# Patient Record
Sex: Male | Born: 1990 | Race: White | Hispanic: No | Marital: Single | Smoking: Never smoker
Health system: Southern US, Community
[De-identification: ages and names within clinical notes are randomized; demographics above are authoritative.]

## PROBLEM LIST (undated history)

## (undated) DIAGNOSIS — S43006A Unspecified dislocation of unspecified shoulder joint, initial encounter: Secondary | ICD-10-CM

---

## 2017-11-28 ENCOUNTER — Emergency Department (HOSPITAL_COMMUNITY): Payer: Self-pay

## 2017-11-28 ENCOUNTER — Encounter (HOSPITAL_COMMUNITY): Payer: Self-pay | Admitting: Emergency Medicine

## 2017-11-28 ENCOUNTER — Other Ambulatory Visit: Payer: Self-pay

## 2017-11-28 ENCOUNTER — Emergency Department (HOSPITAL_COMMUNITY)
Admission: EM | Admit: 2017-11-28 | Discharge: 2017-11-28 | Disposition: A | Discharge status: Home / Self Care | Payer: Self-pay | Attending: Emergency Medicine | Admitting: Emergency Medicine

## 2017-11-28 DIAGNOSIS — Y999 Unspecified external cause status: Secondary | ICD-10-CM | POA: Insufficient documentation

## 2017-11-28 DIAGNOSIS — Y9389 Activity, other specified: Secondary | ICD-10-CM | POA: Insufficient documentation

## 2017-11-28 DIAGNOSIS — Y92013 Bedroom of single-family (private) house as the place of occurrence of the external cause: Secondary | ICD-10-CM | POA: Insufficient documentation

## 2017-11-28 DIAGNOSIS — X58XXXA Exposure to other specified factors, initial encounter: Secondary | ICD-10-CM | POA: Insufficient documentation

## 2017-11-28 DIAGNOSIS — Z79899 Other long term (current) drug therapy: Secondary | ICD-10-CM | POA: Insufficient documentation

## 2017-11-28 DIAGNOSIS — S43005A Unspecified dislocation of left shoulder joint, initial encounter: Secondary | ICD-10-CM | POA: Insufficient documentation

## 2017-11-28 HISTORY — DX: Unspecified dislocation of unspecified shoulder joint, initial encounter: S43.006A

## 2017-11-28 MED ORDER — HYDROCODONE-ACETAMINOPHEN 5-325 MG PO TABS: 1.0000 | ORAL_TABLET | ORAL | 0 refills | Status: AC | PRN

## 2017-11-28 MED ORDER — ETOMIDATE 2 MG/ML IV SOLN
16.0000 mg | Freq: Once | INTRAVENOUS | Status: AC
  Administered 2017-11-28: 14 mg via INTRAVENOUS

## 2017-11-28 MED ORDER — ETOMIDATE 2 MG/ML IV SOLN
INTRAVENOUS | Status: AC
  Filled 2017-11-28: qty 10

## 2017-11-28 MED ORDER — MORPHINE SULFATE (PF) 4 MG/ML IV SOLN
4.0000 mg | Freq: Once | INTRAVENOUS | Status: AC
  Administered 2017-11-28: 4 mg via INTRAVENOUS
  Filled 2017-11-28: qty 1

## 2017-11-28 MED ORDER — ETOMIDATE 2 MG/ML IV SOLN
0.2000 mg/kg | Freq: Once | INTRAVENOUS | Status: AC
  Administered 2017-11-28: 9 mg via INTRAVENOUS

## 2017-11-28 MED ORDER — HYDROMORPHONE HCL 2 MG/ML IJ SOLN
1.0000 mg | Freq: Once | INTRAMUSCULAR | Status: AC
  Administered 2017-11-28: 1 mg via INTRAVENOUS

## 2017-11-28 NOTE — ED Notes (Signed)
Patient transported to X-ray 

## 2017-11-28 NOTE — ED Notes (Signed)
Pt given happy meal.  AO x 4.  Texting.

## 2017-11-28 NOTE — ED Provider Notes (Signed)
[facility] EMERGENCY DEPARTMENT Provider Note   CSN: [id number] Arrival date & time: 11/28/17  0243     History   Chief Complaint Chief Complaint  Patient presents with  . Shoulder Pain    HPI Roy Larsen is a 27 y.o. male.  HPI 27 year old male presents emergency department with complaints of shoulder dislocation.  He recently dislocated his shoulder 4 days ago and states that his shoulder popped out while sleeping approximately 1 hour ago.  He presents with moderate to severe pain in his left shoulder with obvious left shoulder deformity consistent with left shoulder dislocation.  No weakness in his left hand.  No other complaints.   Past Medical History:  Diagnosis Date  . Shoulder dislocation     There are no active problems to display for this patient.   History reviewed. No pertinent surgical history.      Home Medications    Prior to Admission medications   Medication Sig Start Date End Date Taking? Authorizing Provider  Melatonin 3 MG TABS Take 1 tablet by mouth at bedtime as needed (sleep).   Yes [provider]  Multiple Vitamin (MULTIVITAMIN) tablet Take 1 tablet by mouth daily.   Yes [provider]  HYDROcodone-acetaminophen (NORCO/VICODIN) 5-325 MG tablet Take 1 tablet by mouth every 4 (four) hours as needed for moderate pain. 11/28/17   Azalia Bilis, MD    Family History No family history on file.  Social History Social History   Tobacco Use  . Smoking status: Never Smoker  . Smokeless tobacco: Never Used  Substance Use Topics  . Alcohol use: Yes  . Drug use: Not Currently     Allergies   Blueberry flavor and Zofran [ondansetron hcl]   Review of Systems Review of Systems  All other systems reviewed and are negative.    Physical Exam Updated Vital Signs BP 124/78   Pulse (!) 51   Temp 98.6 F (37 C) (Oral)   Resp 11   Ht  (1.88 m)   Wt 99.8 kg (220 lb)   SpO2 100%   BMI 28.25 kg/m     Physical Exam  Constitutional: He is oriented to person, place, and time. He appears well-developed and well-nourished.  HENT:  Head: Normocephalic and atraumatic.  Eyes: EOM are normal.  Neck: Normal range of motion.  Cardiovascular: Normal rate and regular rhythm.  Pulmonary/Chest: Effort normal and breath sounds normal. No respiratory distress.  Abdominal: Soft. He exhibits no distension. There is no tenderness.  Musculoskeletal: Normal range of motion.  Deformity of left shoulder consistent with dislocation.  Normal left radial pulse.  Normal grip strength left hand  Neurological: He is alert and oriented to person, place, and time.  Skin: Skin is warm and dry.  Psychiatric: He has a normal mood and affect. Judgment normal.  Nursing note and vitals reviewed.    ED Treatments / Results  Labs (all labs ordered are listed, but only abnormal results are displayed) Labs Reviewed - No data to display  EKG None  Radiology Dg Shoulder Left  Result Date: 11/28/2017 CLINICAL DATA:  27 y/o  M; left shoulder dislocation for 4 days. EXAM: LEFT SHOULDER - 2+ VIEW COMPARISON:  None. FINDINGS: Left anterior shoulder dislocation.  No acute fracture identified. IMPRESSION: Left anterior shoulder dislocation. Electronically Signed   By: Mitzi Hansen M.D.   On: 11/28/2017 03:52    Procedures Reduction of dislocation Performed by: Azalia Bilis, MD Authorized by: Azalia Bilis, MD  Reduction of dislocation Performed by: Azalia Bilis Consent: Verbal consent obtained. Risks and benefits: risks, benefits and alternatives were discussed Consent given by: patient Required items: required blood products, implants, devices, and special equipment available Time out: Immediately prior to procedure a "time out" was called to verify the correct patient, procedure, equipment, support staff and site/side marked as required. Patient sedated: yes Vitals: Vital signs were monitored  during sedation. Patient tolerance: Patient tolerated the procedure well with no immediate complications. Joint: left shoulder Reduction technique: manipulation   Procedural sedation Performed by: Azalia Bilis Consent: Verbal consent obtained. Risks and benefits: risks, benefits and alternatives were discussed Required items: required blood products, implants, devices, and special equipment available Patient identity confirmed: arm band and provided demographic data Time out: Immediately prior to procedure a "time out" was called to verify the correct patient, procedure, equipment, support staff and site/side marked as required. Sedation type: moderate (conscious) sedation NPO time confirmed and considedered Sedatives: ETOMIDATE Physician Time at Bedside: 15 Vitals: Vital signs were monitored during sedation. Cardiac Monitor, pulse oximeter Patient tolerance: Patient tolerated the procedure well with no immediate complications. Comments: Pt with uneventful recovered. Returned to pre-procedural sedation baseline    Medications Ordered in ED Medications  HYDROmorphone (DILAUDID) injection 1 mg (1 mg Intravenous Given 11/28/17 0602)  etomidate (AMIDATE) injection 19.96 mg (9 mg Intravenous Given 11/28/17 0742)  morphine 4 MG/ML injection 4 mg (4 mg Intravenous Given 11/28/17 0706)  etomidate (AMIDATE) injection 16 mg (14 mg Intravenous Given 11/28/17 0755)     Initial Impression / Assessment and Plan / ED Course  I have reviewed the triage vital signs and the nursing notes.  Pertinent labs & imaging results that were available during my care of the patient were reviewed by me and considered in my medical decision making (see chart for details).     Anterior left shoulder dislocation.  Reduced with procedural sedation.  After sedation as he was waking up his shoulder really dislocated.  He was resuscitated with etomidate and reduced a second time.  Plan will be discharged home with  orthopedic follow-up.  Home in sling.  Standards shoulder dislocation precautions.  Final Clinical Impressions(s) / ED Diagnoses   Final diagnoses:  Shoulder dislocation, left, initial encounter    ED Discharge Orders        Ordered    HYDROcodone-acetaminophen (NORCO/VICODIN) 5-325 MG tablet  Every 4 hours PRN     11/28/17 6213       Azalia Bilis, MD 11/28/17 [id number]

## 2017-11-28 NOTE — Sedation Documentation (Signed)
Left shoulder popped out at this time; sedation started again at this time

## 2017-11-28 NOTE — ED Notes (Signed)
pt A&O x4 ;  no s/s of distress noted , pt walking with steady and strong gait

## 2017-11-28 NOTE — Sedation Documentation (Addendum)
sedation ended at this time

## 2017-11-28 NOTE — ED Triage Notes (Addendum)
Pt states he fell on Tuesday and dislocated L shoulder.  Seen at [facility].  Reports L shoulder dislocating again approx 1 hour ago while he was sleeping.  CMS intact.  Pt is wearing

## 2017-12-03 ENCOUNTER — Encounter (INDEPENDENT_AMBULATORY_CARE_PROVIDER_SITE_OTHER): Payer: Self-pay | Admitting: Orthopaedic Surgery

## 2017-12-03 ENCOUNTER — Ambulatory Visit (INDEPENDENT_AMBULATORY_CARE_PROVIDER_SITE_OTHER): Payer: No Typology Code available for payment source | Admitting: Orthopaedic Surgery

## 2017-12-03 DIAGNOSIS — M25512 Pain in left shoulder: Secondary | ICD-10-CM | POA: Diagnosis not present

## 2017-12-03 NOTE — Progress Notes (Signed)
   Office Visit Note   Patient: Roy Larsen           Date of Birth: [birth year]           MRN: [id number] Visit Date: 12/03/2017              Requested by: No referring provider defined for this encounter. PCP: Patient, No Pcp Per   Assessment & Plan: Visit Diagnoses:  1. Acute pain of left shoulder     Plan: Impression is traumatic anterior left shoulder dislocation which has been reduced.  We will have the patient continue wearing a sling for 3 weeks.  Nonweightbearing to the left upper extremity.  Follow-up with [place] in 3 weeks time for repeat evaluation.  Follow-Up Instructions: Return in about 3 weeks (around 12/24/2017).   Orders:  No orders of the defined types were placed in this encounter.  No orders of the defined types were placed in this encounter.     Procedures: No procedures performed   Clinical Data: No additional findings.   Subjective: Chief Complaint  Patient presents with  . Left Shoulder - Dislocation, Pain    HPI patient is a pleasant 27 year old gentleman who presents to our clinic today with a new injury to his left shoulder.  On 11/24/2017 while at work he tripped falling on an outstretched left hand.  He noticed obvious deformity to his shoulder and was taken to the emergency room.  It was noted that he had an anterior dislocation to the left shoulder.  This was reduced and he was placed in a sling.   on 11/27/2017 while sleeping without the sling, he noted a recurrent dislocation to the left shoulder.  He was again taken to the ED where this was reduced once more.  He was placed back in a sling.  He was given a prescription for Norco but has not healed this.  He has been in moderate pain.  Worse with motion of the shoulder.  No numbness, tingling or burning.  Review of Systems as detailed in HPI.  All others reviewed and are negative.   Objective: Vital Signs: There were no vitals taken for this visit.  Physical Exam well-developed well-nourished  gentleman in no acute distress.  Alert and oriented x3.  Ortho Exam examination of his left shoulder reveals limited motion secondary to pain.  Motor and sensory function intact.  Specialty Comments:  No specialty comments available.  Imaging: Imaging reviewed by me in canopy reveals pre-reduction imaging to include an anterior dislocation with an appropriate reduction on following x-rays.     PMFS History: Patient Active Problem List   Diagnosis Date Noted  . Acute pain of left shoulder 12/03/2017   Past Medical History:  Diagnosis Date  . Shoulder dislocation     History reviewed. No pertinent family history.  History reviewed. No pertinent surgical history. Social History   Occupational History  . Not on file  Tobacco Use  . Smoking status: Never Smoker  . Smokeless tobacco: Never Used  Substance and Sexual Activity  . Alcohol use: Yes  . Drug use: Not Currently  . Sexual activity: Not on file

## 2017-12-24 ENCOUNTER — Encounter (INDEPENDENT_AMBULATORY_CARE_PROVIDER_SITE_OTHER): Payer: Self-pay | Admitting: Orthopaedic Surgery

## 2017-12-24 ENCOUNTER — Ambulatory Visit (INDEPENDENT_AMBULATORY_CARE_PROVIDER_SITE_OTHER): Payer: No Typology Code available for payment source | Admitting: Orthopaedic Surgery

## 2017-12-24 ENCOUNTER — Ambulatory Visit (INDEPENDENT_AMBULATORY_CARE_PROVIDER_SITE_OTHER): Payer: Self-pay

## 2017-12-24 DIAGNOSIS — M25512 Pain in left shoulder: Secondary | ICD-10-CM

## 2017-12-24 NOTE — Progress Notes (Signed)
   Office Visit Note   Patient: Roy Larsen           Date of Birth: Jan 30, 1991           MRN: [id number] Visit Date: 12/24/2017              Requested by: No referring provider defined for this encounter. PCP: Patient, No Pcp Per   Assessment & Plan: Visit Diagnoses:  1. Acute pain of left shoulder     Plan: Impression is 4 weeks status post left shoulder dislocation with some mild anterior translation.  At this point would like to get him into physical therapy for strengthening.  I would like him to wear the sling when he is in public.  Recheck in 1 month.  If he still having problems we may need to consider an MR arthrogram of the left shoulder.  Follow-Up Instructions: Return in about 1 month (around 01/21/2018).   Orders:  Orders Placed This Encounter  Procedures  . XR Shoulder Left   No orders of the defined types were placed in this encounter.     Procedures: No procedures performed   Clinical Data: No additional findings.   Subjective: Chief Complaint  Patient presents with  . Left Shoulder - Follow-up    28/07/19 shoulder dislocation    Roy Larsen is a 27 year old gentleman who comes in for follow-up of his left shoulder dislocation which he sustained on 11/24/2017.  He has not been wearing his sling the last week since it broke.  He feels an unstable sensation within the shoulder.  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  He states that he is a chronic 5 out of 10 pain.  Denies any numbness and tingling.   Review of Systems  Constitutional: Negative.   All other systems reviewed and are negative.    Objective: Vital Signs: There were no vitals taken for this visit.  Physical Exam  Constitutional: He is oriented to person, place, and time. He appears well-developed and well-nourished.  Pulmonary/Chest: Effort normal.  Abdominal: Soft.  Neurological: He is alert and oriented to person, place, and time.  Skin: Skin is warm.  Psychiatric: He  has a normal mood and affect. His behavior is normal. Judgment and thought content normal.  Nursing note and vitals reviewed.   Ortho Exam Left shoulder exam shows positive apprehension.  Rotator cuff is grossly intact. Specialty Comments:  No specialty comments available.  Imaging: Xr Shoulder Left  Result Date: 12/24/2017 Mild anterior translation of humeral head on glenoid surface.  No dislocation.    PMFS History: Patient Active Problem List   Diagnosis Date Noted  . Acute pain of left shoulder 12/03/2017   Past Medical History:  Diagnosis Date  . Shoulder dislocation     History reviewed. No pertinent family history.  History reviewed. No pertinent surgical history. Social History   Occupational History  . Not on file  Tobacco Use  . Smoking status: Never Smoker  . Smokeless tobacco: Never Used  Substance and Sexual Activity  . Alcohol use: Yes  . Drug use: Not Currently  . Sexual activity: Not on file

## 2018-01-28 ENCOUNTER — Ambulatory Visit (INDEPENDENT_AMBULATORY_CARE_PROVIDER_SITE_OTHER): Payer: Self-pay | Admitting: Orthopaedic Surgery

## 2018-04-21 ENCOUNTER — Ambulatory Visit (INDEPENDENT_AMBULATORY_CARE_PROVIDER_SITE_OTHER): Payer: Self-pay | Admitting: Orthopaedic Surgery

## 2020-02-10 IMAGING — DX DG SHOULDER 2+V*L*
2 series · 2 of 2 positions shown · non-contrast
Comparison: [place] [facility] [DATE] a.m.

CLINICAL DATA: Left shoulder dislocation status post reduction.

EXAM:
LEFT SHOULDER - 2+ VIEW

[shoulder grashey]
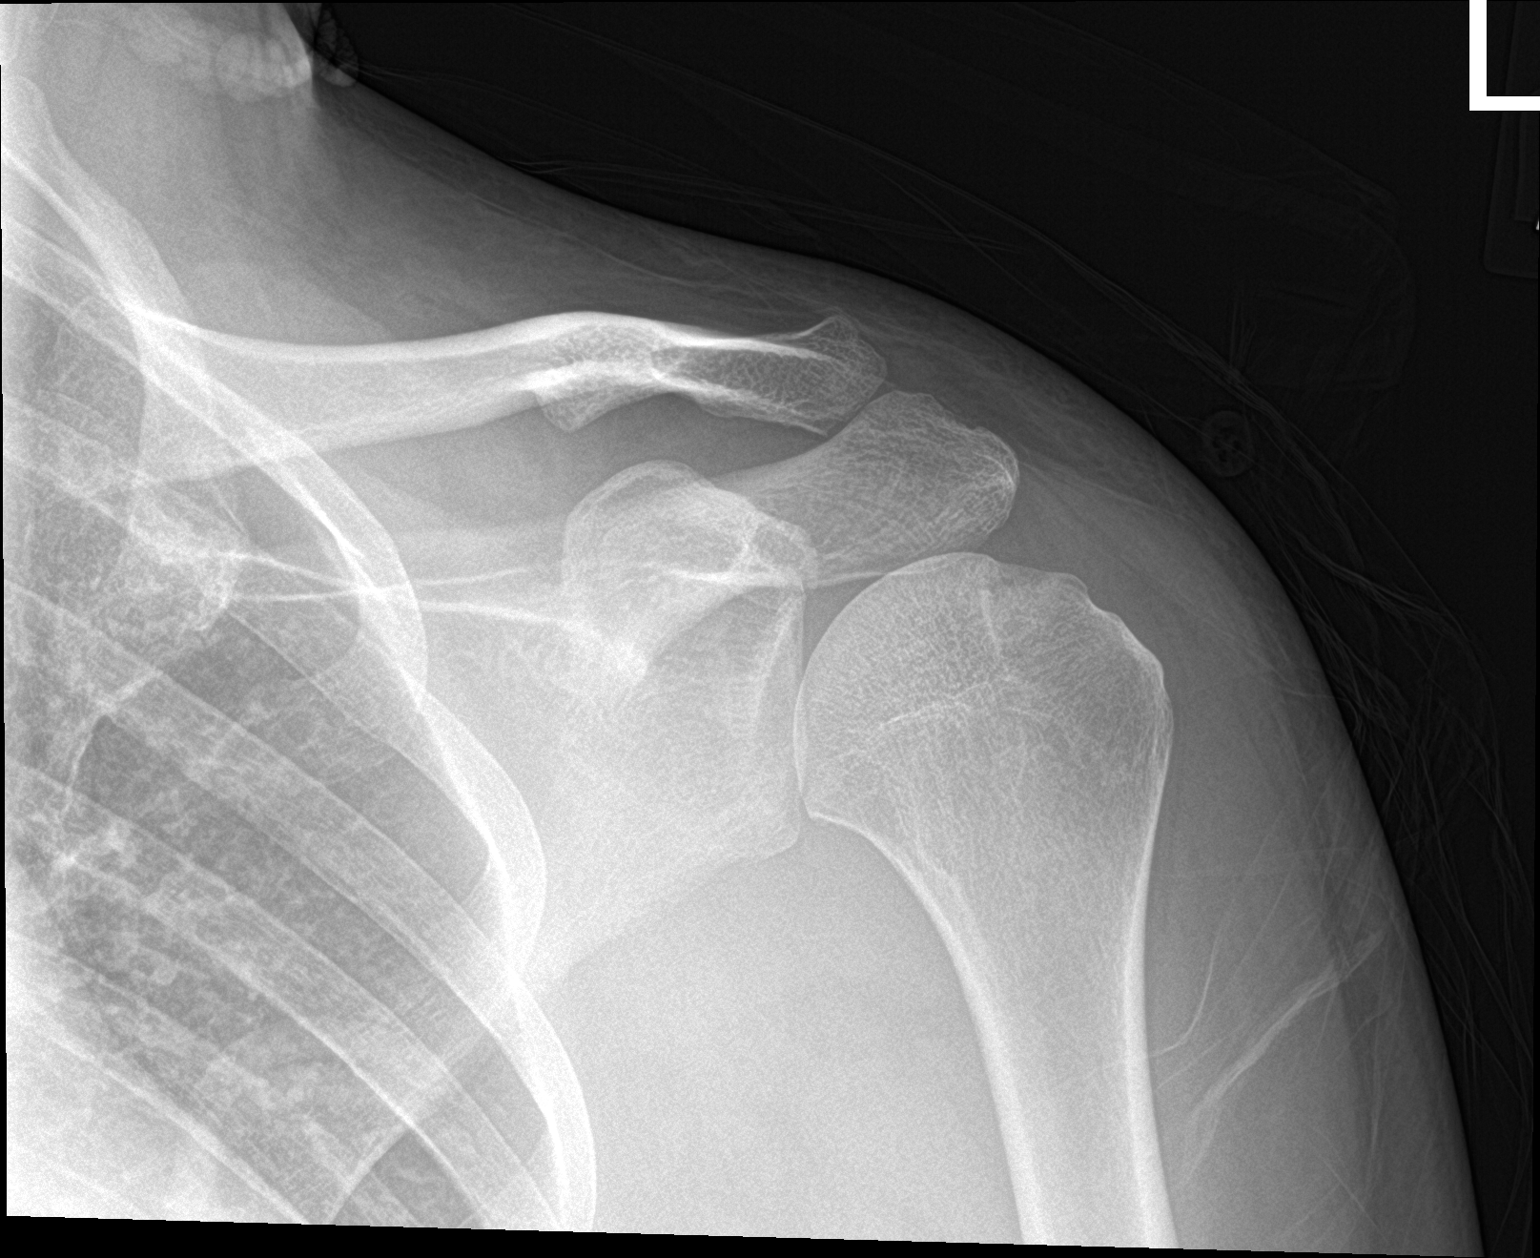

[shoulder y view]
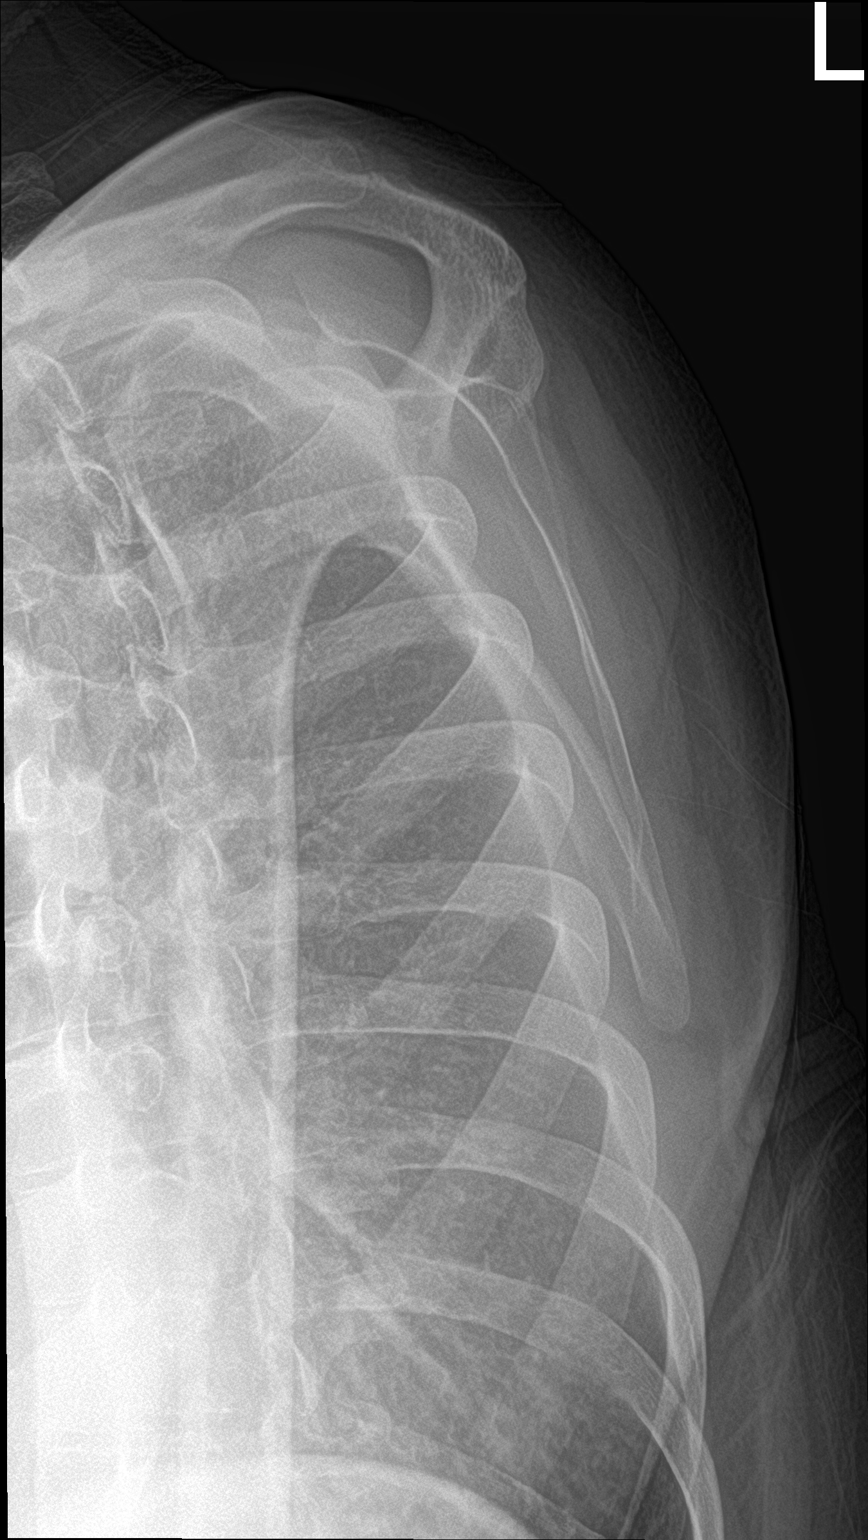

[2 of 2 positions shown; findings below may reference images not displayed]

FINDINGS: There is no evidence of fracture or dislocation. The previously
noted left shoulder dislocation has been reduced. There is no
evidence of arthropathy or other focal bone abnormality. Soft
tissues are unremarkable.
IMPRESSION: Previously noted left shoulder dislocation has been reduced. There
is no acute fracture or dislocation.

## 2020-02-10 IMAGING — DX DG SHOULDER 2+V*L*
2 series · 3 of 3 positions shown · non-contrast
Comparison: None.

CLINICAL DATA: 26 y/o  M; left shoulder dislocation for 4 days.

EXAM:
LEFT SHOULDER - 2+ VIEW

[Series 1: shoulder grashey · 0.14mm/px · 2 of 2 slices shown]
[im 1/2]
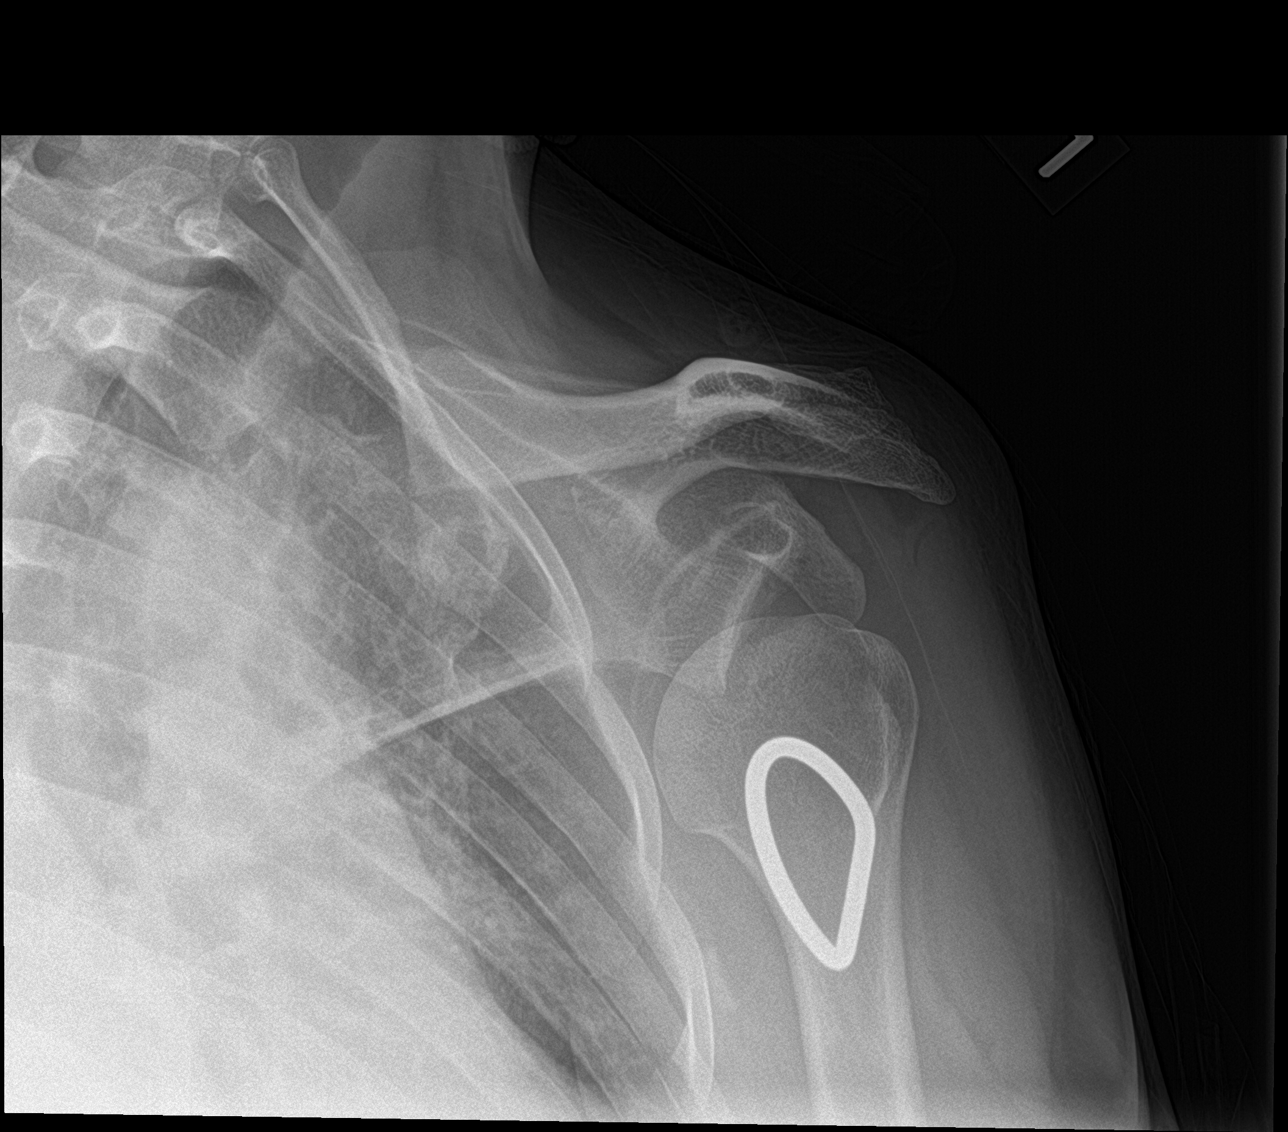
[im 2/2]
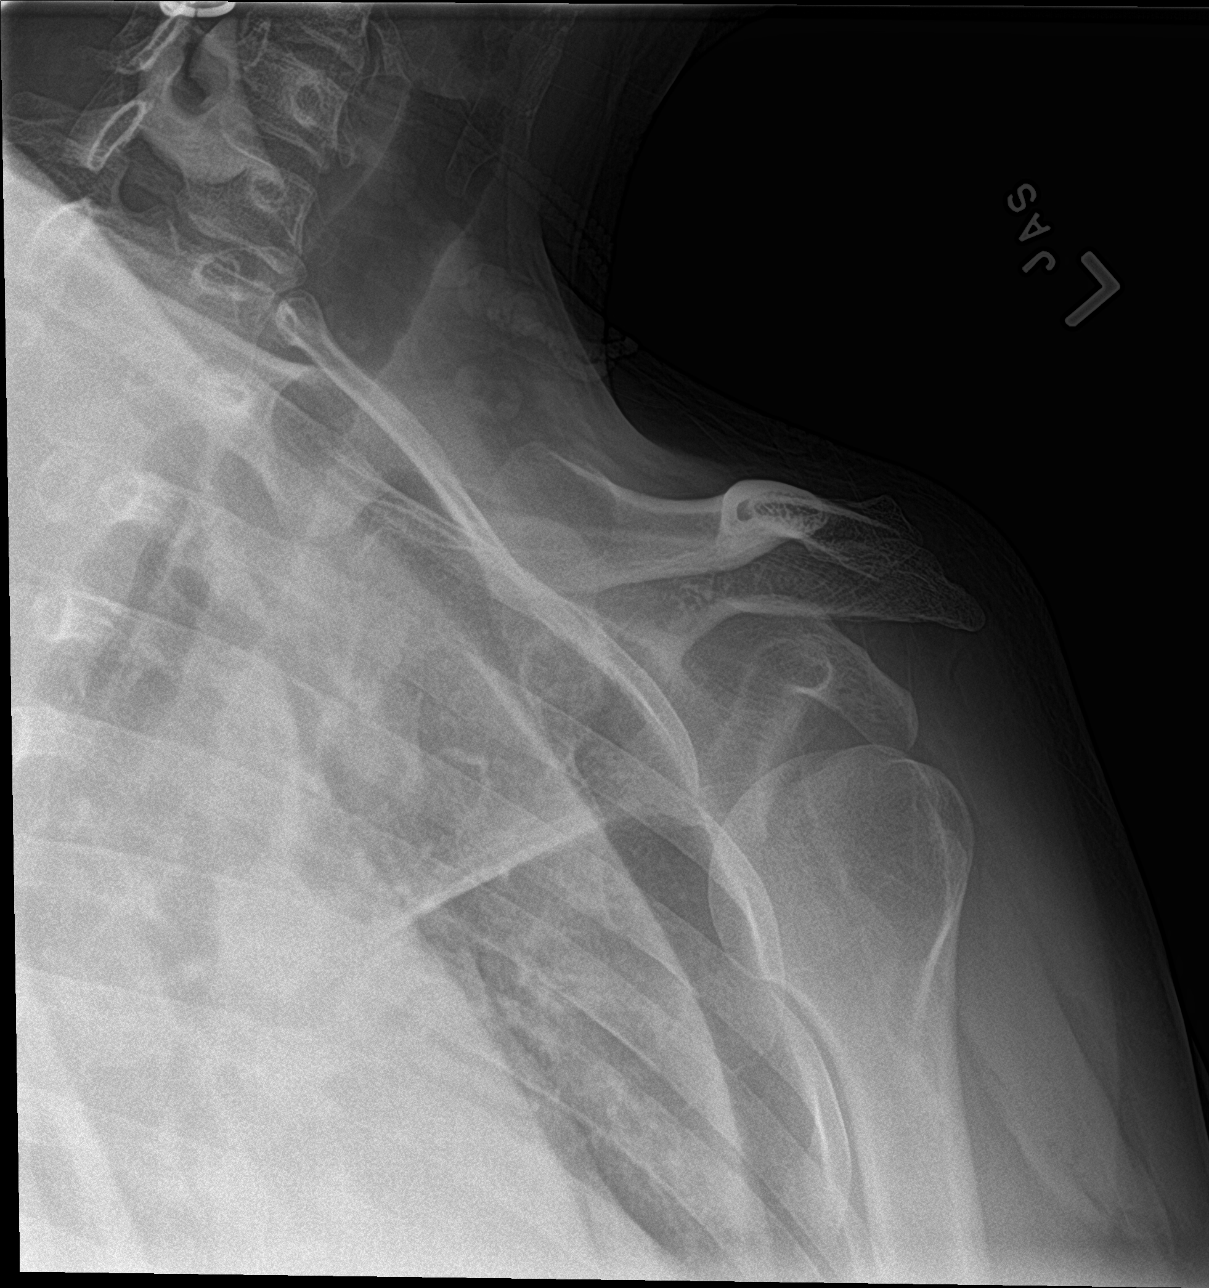

[shoulder y view]
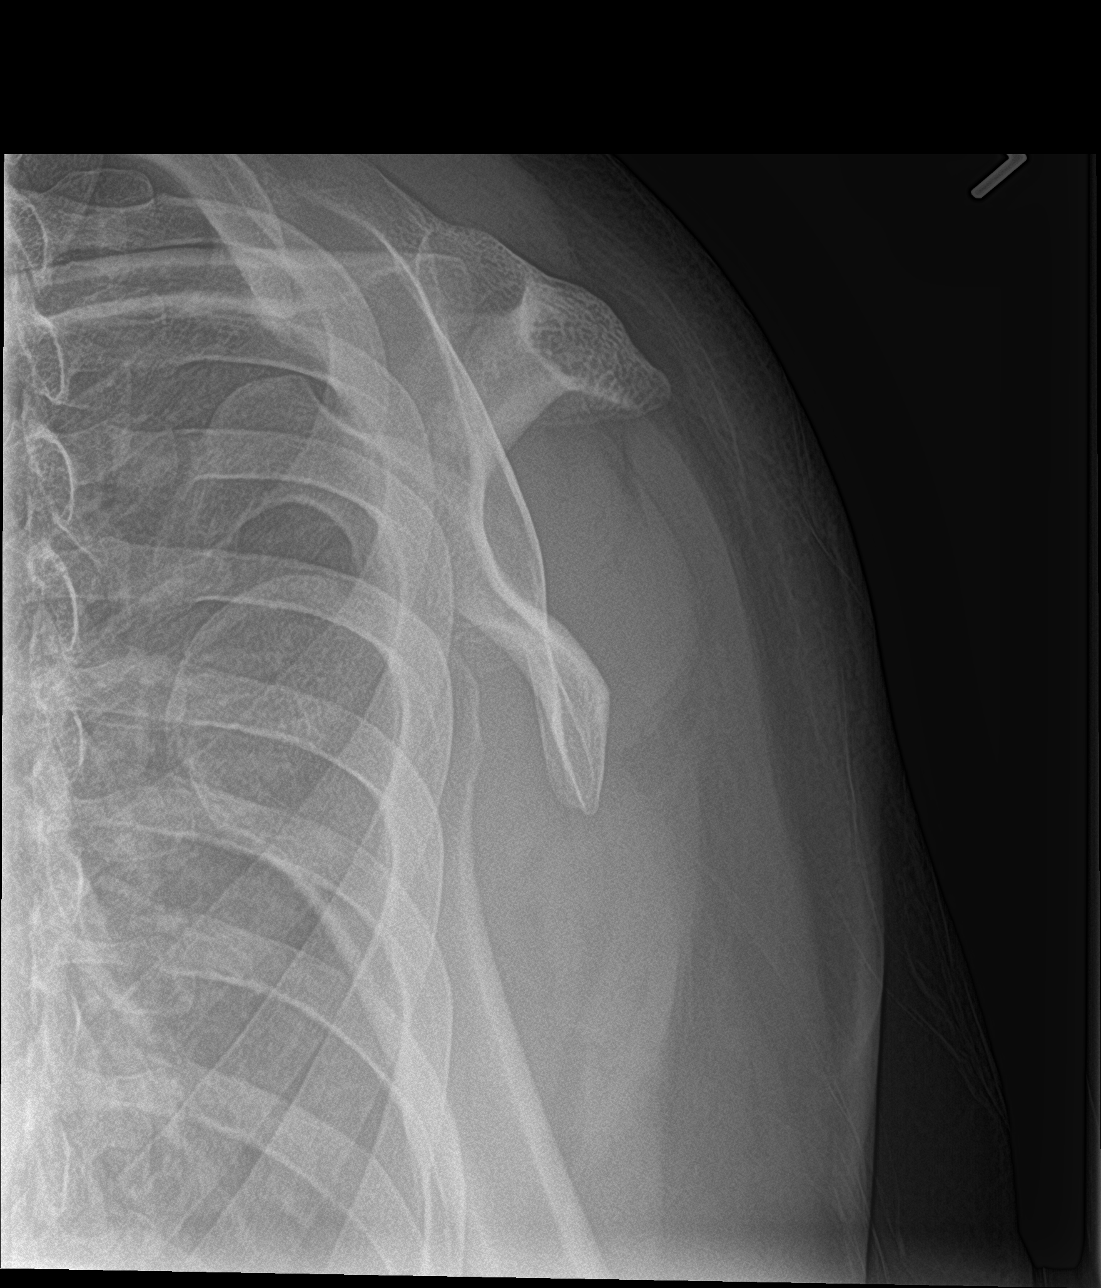

[3 of 3 positions shown; findings below may reference images not displayed]

FINDINGS: Left anterior shoulder dislocation.  No acute fracture identified.
IMPRESSION: Left anterior shoulder dislocation.

By: Natallia Kass M.D.
